# Patient Record
Sex: Male | Born: 1990 | Race: White | Hispanic: No | Marital: Single | Smoking: Current every day smoker
Health system: Southern US, Community
[De-identification: ages and names within clinical notes are randomized; demographics above are authoritative.]

---
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
## 2011-07-11 ENCOUNTER — Emergency Department: Payer: Self-pay | Admitting: Emergency Medicine

## 2011-10-02 ENCOUNTER — Emergency Department: Payer: Self-pay | Admitting: Emergency Medicine

## 2013-02-06 ENCOUNTER — Emergency Department: Payer: Self-pay | Admitting: Internal Medicine

## 2013-02-06 LAB — URINALYSIS, COMPLETE
Bacteria: NONE SEEN
Bilirubin,UR: NEGATIVE
Glucose,UR: NEGATIVE mg/dL (ref 0–75)
Nitrite: NEGATIVE
Ph: 6 (ref 4.5–8.0)

## 2013-02-06 LAB — COMPREHENSIVE METABOLIC PANEL
Alkaline Phosphatase: 129 U/L (ref 50–136)
Bilirubin,Total: 0.3 mg/dL (ref 0.2–1.0)
Calcium, Total: 9.2 mg/dL (ref 8.5–10.1)
Chloride: 104 mmol/L (ref 98–107)
EGFR (Non-African Amer.): >60
Glucose: 100 mg/dL — ABNORMAL HIGH (ref 65–99)
Osmolality: 270 (ref 275–301)
Potassium: 3.8 mmol/L (ref 3.5–5.1)
SGOT(AST): 22 U/L (ref 15–37)
Sodium: 136 mmol/L (ref 136–145)
Total Protein: 8.7 g/dL — ABNORMAL HIGH (ref 6.4–8.2)

## 2013-02-06 LAB — CBC
HCT: 46.7 % (ref 40.0–52.0)
Platelet: 289 10*3/uL (ref 150–440)
WBC: 14.5 10*3/uL — ABNORMAL HIGH (ref 3.8–10.6)

## 2013-02-06 LAB — LIPASE, BLOOD: Lipase: 62 U/L — ABNORMAL LOW (ref 73–393)

## 2014-06-04 IMAGING — CT CT ABD-PELV W/ CM
1 of 2 series · 15 of 32 positions shown, 19 images · IV contrast (isovue)
Comparison: None

REASON FOR EXAM: (1) abd pin; (2) pel pain
COMMENTS:

PROCEDURE:     CT  - CT ABDOMEN / PELVIS  W  - February 06, 2013  [DATE]
RESULT:     History: Abdominal pain
TECHNIQUE: Multiple axial images of the abdomen and pelvis were performed
from the lung bases to the pubic symphysis, without p.o. contrast and with
100 ml of Isovue 370 intravenous contrast.

[Series 2: 3mm soft tissue · axial · 0.68mm/px · z∈[+70,+470]mm · 15 of 145 slices shown, 19 images]
[im 6/145  soft-tissue]
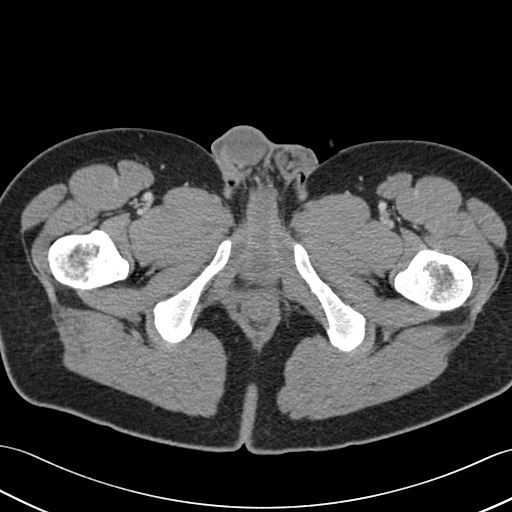
[im 6/145  bone]
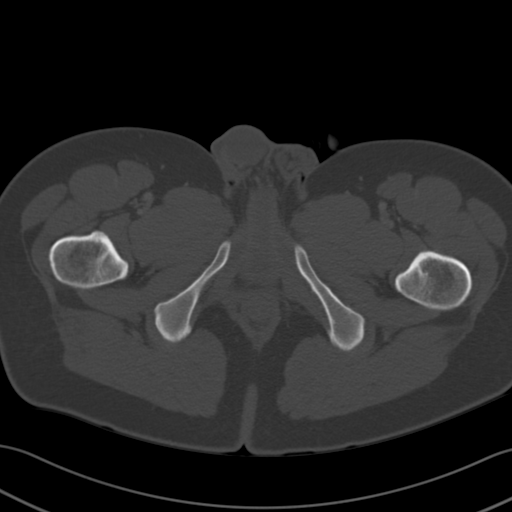
[im 18/145  soft-tissue]
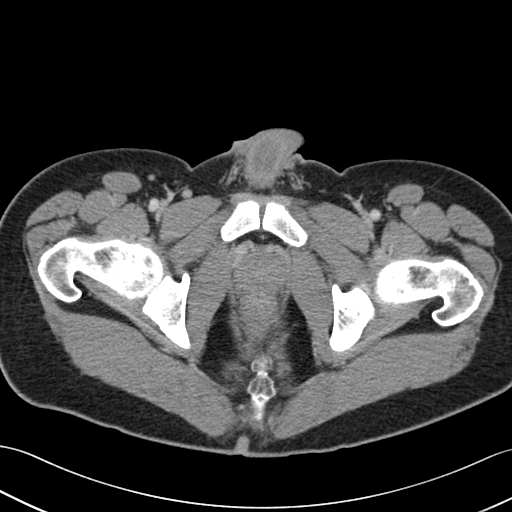
[im 29/145  soft-tissue]
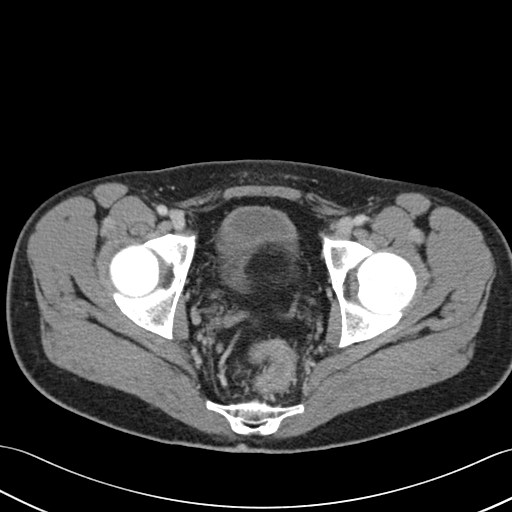
[im 41/145  soft-tissue]
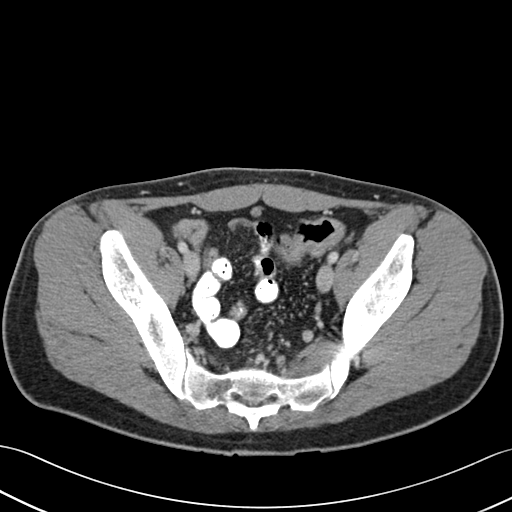
[im 52/145  soft-tissue]
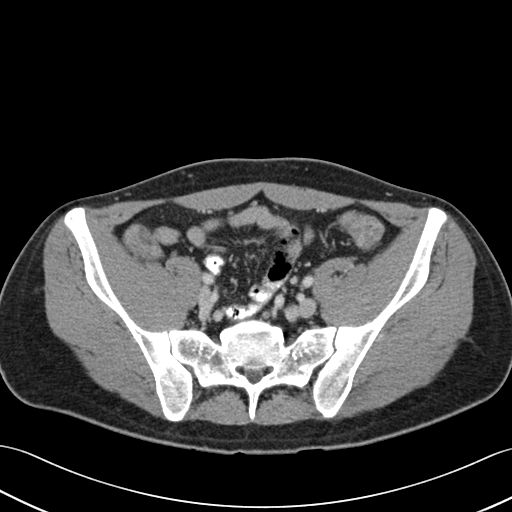
[im 64/145  soft-tissue]
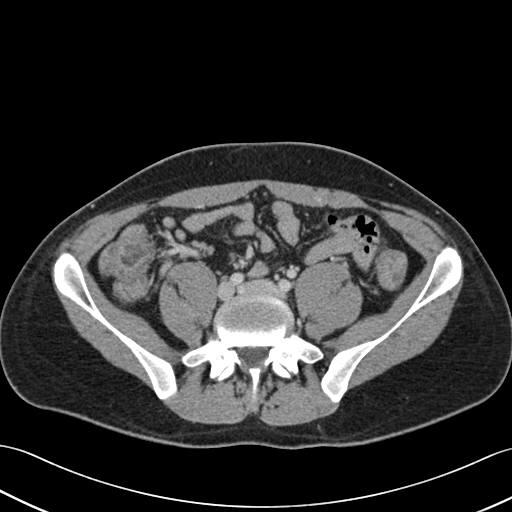
[im 75/145  soft-tissue]
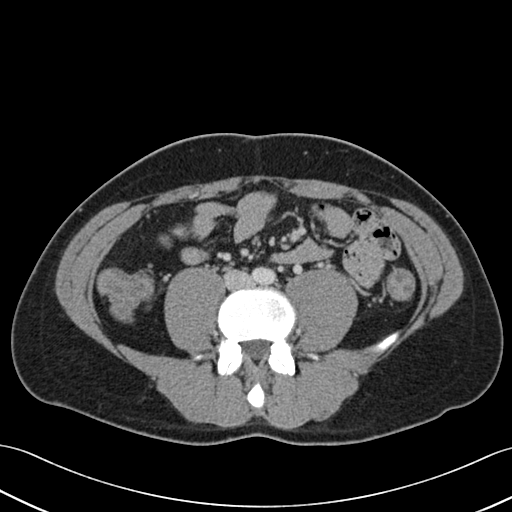
[im 81/145  soft-tissue]
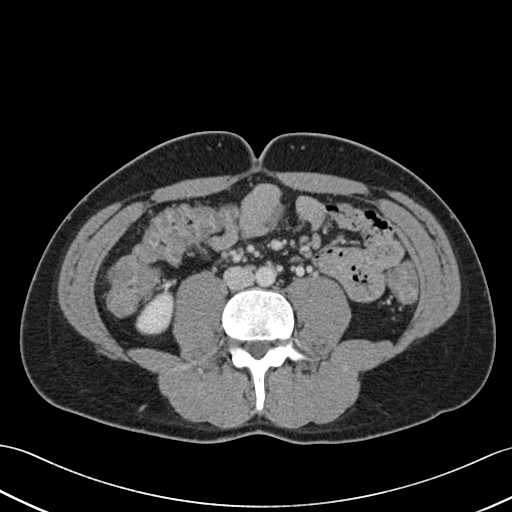
[im 93/145  soft-tissue]
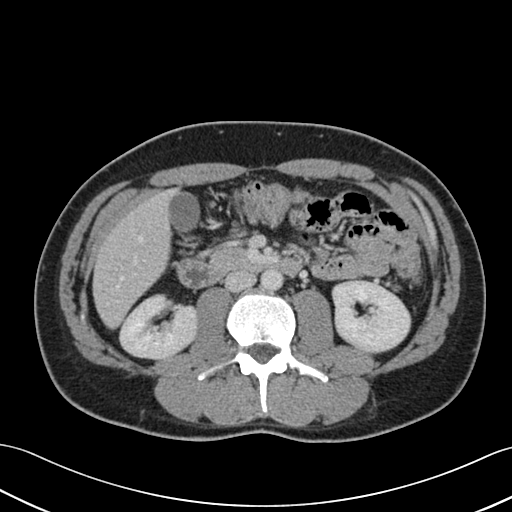
[im 93/145  bone]
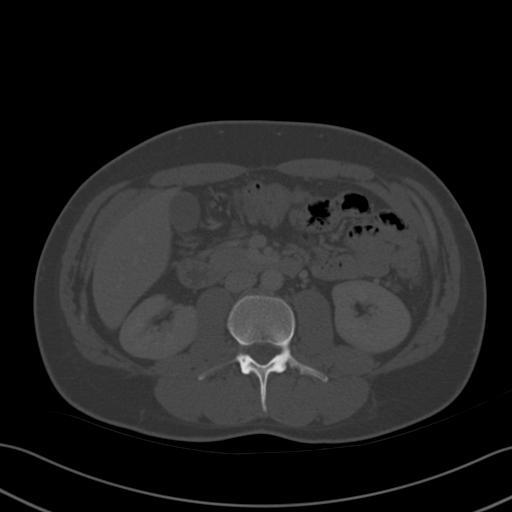
[im 104/145  soft-tissue]
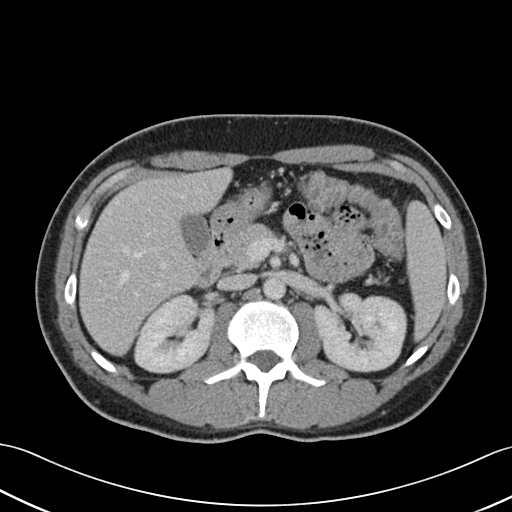
[im 116/145  soft-tissue]
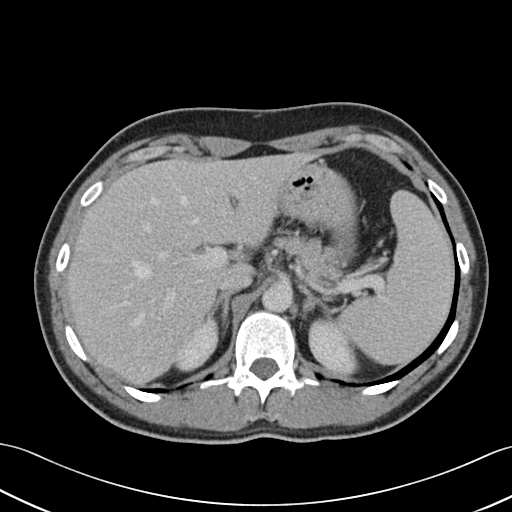
[im 121/145  lung]
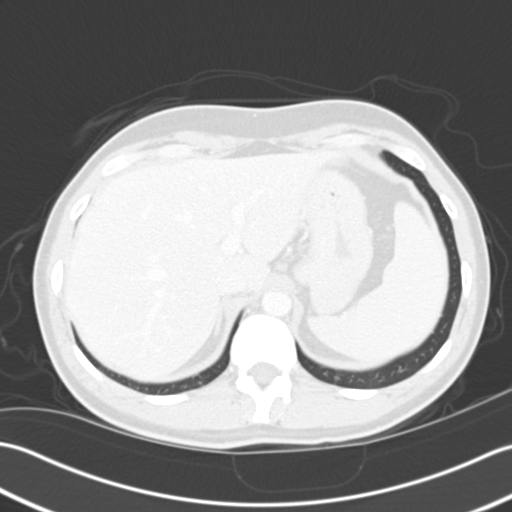
[im 127/145  soft-tissue]
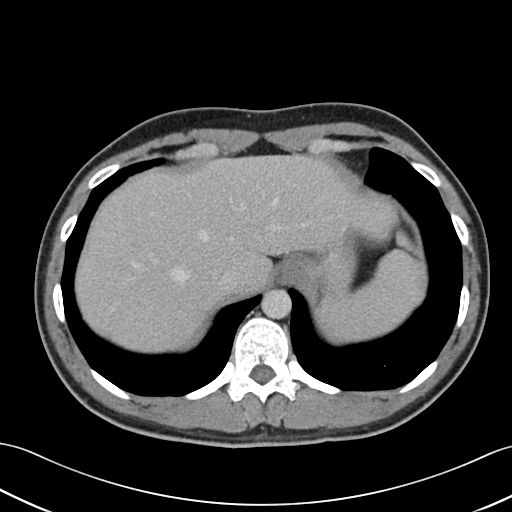
[im 127/145  lung]
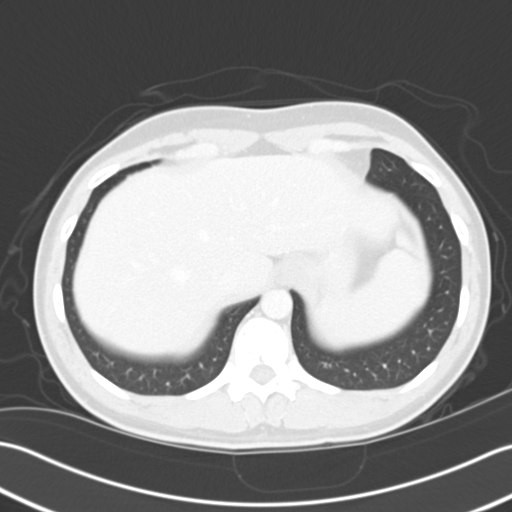
[im 133/145  lung]
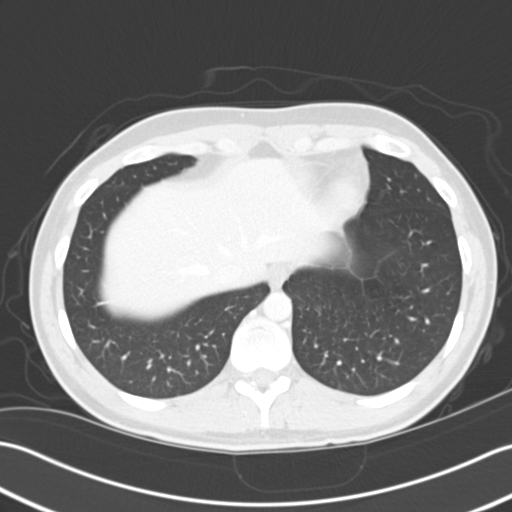
[im 139/145  soft-tissue]
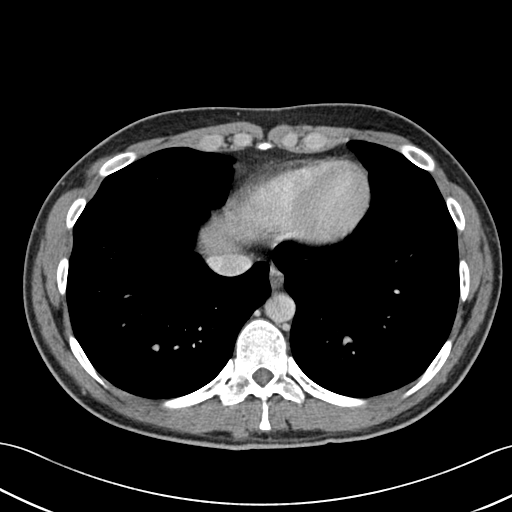
[im 139/145  lung]
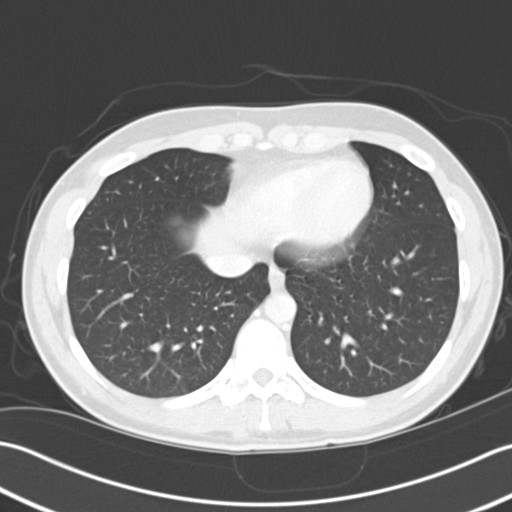

[15 of 32 positions shown; findings below may reference images not displayed]

FINDINGS: The lung bases are clear. There is no pneumothorax. The heart size is
normal.

The liver demonstrates no focal abnormality. There is no intrahepatic or
extrahepatic biliary ductal dilatation. The gallbladder is unremarkable. The
spleen demonstrates no focal abnormality. The kidneys, adrenal glands, and
pancreas are normal. The bladder is unremarkable.

There is relative bowel wall thickening involving the ascending colon,
transverse colon, descending colon and sigmoid colon which may be secondary
to underdistention versus mild colitis. Evaluation is limited secondary to
lack of enteric contrast. There is no pneumoperitoneum, pneumatosis, or
portal venous gas. There is no abdominal or pelvic free fluid. There is no
lymphadenopathy.

The abdominal aorta is normal in caliber .

The osseous structures are unremarkable.
IMPRESSION: 1. There is relative bowel wall thickening involving the ascending colon,
transverse colon, descending colon and sigmoid colon which may be secondary
to underdistention versus mild colitis. Evaluation is limited secondary to
lack of enteric contra[REDACTED]

## 2023-01-12 ENCOUNTER — Encounter: Payer: Self-pay | Admitting: Emergency Medicine

## 2023-01-12 ENCOUNTER — Emergency Department
Admission: EM | Admit: 2023-01-12 | Discharge: 2023-01-12 | Disposition: A | Discharge status: Home / Self Care | Payer: Self-pay | Attending: Emergency Medicine | Admitting: Emergency Medicine

## 2023-01-12 ENCOUNTER — Other Ambulatory Visit: Payer: Self-pay

## 2023-01-12 DIAGNOSIS — R21 Rash and other nonspecific skin eruption: Secondary | ICD-10-CM

## 2023-01-12 DIAGNOSIS — T7840XA Allergy, unspecified, initial encounter: Secondary | ICD-10-CM | POA: Insufficient documentation

## 2023-01-12 LAB — CBC WITH DIFFERENTIAL/PLATELET
Abs Immature Granulocytes: 0.01 10*3/uL (ref 0.00–0.07)
Basophils Absolute: 0 10*3/uL (ref 0.0–0.1)
Basophils Relative: 0 %
Eosinophils Absolute: 1.2 10*3/uL — ABNORMAL HIGH (ref 0.0–0.5)
Eosinophils Relative: 14 %
HCT: 44 % (ref 39.0–52.0)
Hemoglobin: 15.2 g/dL (ref 13.0–17.0)
Immature Granulocytes: 0 %
Lymphocytes Relative: 25 %
Lymphs Abs: 2.1 10*3/uL (ref 0.7–4.0)
MCH: 30.7 pg (ref 26.0–34.0)
MCHC: 34.5 g/dL (ref 30.0–36.0)
MCV: 88.9 fL (ref 80.0–100.0)
Monocytes Absolute: 0.5 10*3/uL (ref 0.1–1.0)
Monocytes Relative: 5 %
Neutro Abs: 4.8 10*3/uL (ref 1.7–7.7)
Neutrophils Relative %: 56 %
Platelets: 316 10*3/uL (ref 150–400)
RBC: 4.95 MIL/uL (ref 4.22–5.81)
RDW: 11.8 % (ref 11.5–15.5)
WBC: 8.7 10*3/uL (ref 4.0–10.5)
nRBC: 0 % (ref 0.0–0.2)

## 2023-01-12 LAB — COMPREHENSIVE METABOLIC PANEL
ALT: 33 U/L (ref 0–44)
AST: 32 U/L (ref 15–41)
Albumin: 4 g/dL (ref 3.5–5.0)
Alkaline Phosphatase: 89 U/L (ref 38–126)
Anion gap: 9 (ref 5–15)
BUN: 11 mg/dL (ref 6–20)
CO2: 24 mmol/L (ref 22–32)
Calcium: 8.7 mg/dL — ABNORMAL LOW (ref 8.9–10.3)
Chloride: 102 mmol/L (ref 98–111)
Creatinine, Ser: 0.88 mg/dL (ref 0.61–1.24)
GFR, Estimated: >60 mL/min (ref >60)
Glucose, Bld: 119 mg/dL — ABNORMAL HIGH (ref 70–99)
Potassium: 3.7 mmol/L (ref 3.5–5.1)
Sodium: 135 mmol/L (ref 135–145)
Total Bilirubin: 0.5 mg/dL (ref 0.3–1.2)
Total Protein: 6.9 g/dL (ref 6.5–8.1)

## 2023-01-12 MED ORDER — DOXYCYCLINE HYCLATE 100 MG PO TABS: 100.0000 mg | ORAL_TABLET | Freq: Two times a day (BID) | ORAL | 0 refills | Status: AC

## 2023-01-12 MED ORDER — DOXYCYCLINE HYCLATE 100 MG PO TABS
100.0000 mg | ORAL_TABLET | Freq: Once | ORAL | Status: AC
  Administered 2023-01-12: 100 mg via ORAL
  Filled 2023-01-12: qty 1

## 2023-01-12 MED ORDER — FLUCONAZOLE 150 MG PO TABS: 150.0000 mg | ORAL_TABLET | ORAL | 0 refills | Status: AC

## 2023-01-12 MED ORDER — HYDROXYZINE PAMOATE 100 MG PO CAPS: 100.0000 mg | ORAL_CAPSULE | Freq: Three times a day (TID) | ORAL | 0 refills | Status: AC | PRN

## 2023-01-12 NOTE — ED Provider Notes (Signed)
[facility] Provider Note  Patient Contact: 8:00 PM (approximate)   History   Allergic Reaction and Rash   HPI  Dylan Allen is a 32 y.o. male who presents the emergency department complaining of a worsening rash to his torso.  Patient had a small amount of rash develop over his abdomen, had a televisit and was diagnosed with allergic reaction versus contact dermatitis.  Given prescription of triamcinolone patient states that he applies his head appears that the rash is spreading as he applies the topical steroid.  Patient states it started to itch and now has a burn.  It spread to both legs as well.  No fevers or chills.  No history of recurrent skin infections.  Appears that this was fine bumps to begin with with no evidence of hives or wheals.     Physical Exam   Triage Vital Signs: ED Triage Vitals [01/12/23 1942]  Enc Vitals Group     BP 118/82     Pulse Rate (!) 113     Resp 20     Temp 97.7 F (36.5 C)     Temp Source Oral     SpO2 97 %     Weight 226 lb (102.5 kg)     Height      Head Circumference      Peak Flow      Pain Score 4     Pain Loc      Pain Edu?      Excl. in [place]?     Most recent vital signs: Vitals:   01/12/23 1942  BP: 118/82  Pulse: (!) 113  Resp: 20  Temp: 97.7 F (36.5 C)  SpO2: 97%     General: Alert and in no acute distress.  Cardiovascular:  Good peripheral perfusion Respiratory: Normal respiratory effort without tachypnea or retractions. Lungs CTAB.  Musculoskeletal: Full range of motion to all extremities.  Neurologic:  No gross focal neurologic deficits are appreciated.  Skin:   Patient has an erythematous rough rash to his torso and legs.  Warm to the touch.  Some firmness but no fluctuance.  No drainage.  Patient does have some fine vesicle formation to the thighs. Other:   ED Results / Procedures / Treatments   Labs (all labs ordered are listed, but only abnormal results are displayed) Labs  Reviewed  COMPREHENSIVE METABOLIC PANEL - Abnormal; Notable for the following components:      Result Value   Glucose, Bld 119 (*)    Calcium 8.7 (*)    All other components within normal limits  CBC WITH DIFFERENTIAL/PLATELET - Abnormal; Notable for the following components:   Eosinophils Absolute 1.2 (*)    All other components within normal limits     EKG     RADIOLOGY    No results found.  PROCEDURES:  Critical Care performed: No  Procedures   MEDICATIONS ORDERED IN ED: Medications  doxycycline (VIBRA-TABS) tablet 100 mg (has no administration in time range)     IMPRESSION / MDM / ASSESSMENT AND PLAN / ED COURSE  I reviewed the triage vital signs and the nursing notes.                                 Differential diagnosis includes, but is not limited to, cellulitis, folliculitis, contact dermatitis, candidal infection  Patient's presentation is most consistent with acute presentation with potential threat  to life or bodily function.   Patient's diagnosis is consistent with rash.  Patient presents emergency department with a worsening rash.  At this point it appears that patient has confluent folliculitis that is turned into cellulitis with possibly an area of candidal infection along the skin fold.  Patient states that initially it was fine small dots across his abdomen and he was treated with steroid and ivermectin.  Patient states that his rash has been worsening.  Labs are reassuring.  On physical exam it appears more consistent with confluent folliculitis.  At this time we will treat with antibiotics and antifungals.  Hydroxyzine for itching.  Stop the ivermectin and topical steroid.  Follow-up primary care as needed..  Patient is given ED precautions to return to the ED for any worsening or new symptoms.     FINAL CLINICAL IMPRESSION(S) / ED DIAGNOSES   Final diagnoses:  Rash     Rx / DC Orders   ED Discharge Orders          Ordered     doxycycline (VIBRA-TABS) 100 MG tablet  2 times daily        01/12/23 2213    fluconazole (DIFLUCAN) 150 MG tablet  Weekly        01/12/23 2213    hydrOXYzine (VISTARIL) 100 MG capsule  3 times daily PRN        01/12/23 2213             Note:  This document was prepared using Dragon voice recognition software and may include unintentional dictation errors.   Brynda Peon 01/12/23 2215    Lucillie Garfinkel, MD 01/15/23 [phone]

## 2023-01-12 NOTE — ED Notes (Signed)
Discharge instructions explained to patient and family at this time. Patient and family state they understand and agree.

## 2023-01-12 NOTE — ED Triage Notes (Signed)
Pt in with generalized body rash, onset 2 wks ago. Pt states he saw a teledoc yesterday, and was prescribed Triamcinolone, and anti-parasitic but rash has spread since applying it. Pt states it began as a small reddened itchy rash to abdomen, but has since spread to entire torso and thighs, and into extremities. Pt denies any sob or throat swelling, states he has been taking benadryl x 2 weeks. No known allergies that he's aware of.NAD in triage, VSS

## 2023-01-12 NOTE — ED Notes (Signed)
Flex provider assessing while in triage
